# Patient Record
Sex: Male | Born: 1980 | Race: Black or African American | Hispanic: No | Marital: Married | Smoking: Current every day smoker
Health system: Southern US, Community
[De-identification: ages and names within clinical notes are randomized; demographics above are authoritative.]

## PROBLEM LIST (undated history)

## (undated) DIAGNOSIS — R0789 Other chest pain: Secondary | ICD-10-CM

## (undated) DIAGNOSIS — Z0189 Encounter for other specified special examinations: Secondary | ICD-10-CM

## (undated) DIAGNOSIS — I10 Essential (primary) hypertension: Secondary | ICD-10-CM

## (undated) HISTORY — DX: Other chest pain: R07.89

## (undated) HISTORY — DX: Encounter for other specified special examinations: Z01.89

---
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
## 2005-10-08 ENCOUNTER — Ambulatory Visit (HOSPITAL_COMMUNITY): Admission: RE | Admit: 2005-10-08 | Discharge: 2005-10-08 | Payer: Self-pay | Admitting: Family Medicine

## 2011-01-05 ENCOUNTER — Encounter: Payer: Self-pay | Admitting: Family Medicine

## 2012-10-21 ENCOUNTER — Emergency Department (HOSPITAL_COMMUNITY)
Admission: EM | Admit: 2012-10-21 | Discharge: 2012-10-21 | Disposition: A | Discharge status: Home / Self Care | Payer: 59 | Attending: Emergency Medicine | Admitting: Emergency Medicine

## 2012-10-21 ENCOUNTER — Encounter (HOSPITAL_COMMUNITY): Payer: Self-pay | Admitting: Emergency Medicine

## 2012-10-21 DIAGNOSIS — F17200 Nicotine dependence, unspecified, uncomplicated: Secondary | ICD-10-CM | POA: Insufficient documentation

## 2012-10-21 DIAGNOSIS — Z791 Long term (current) use of non-steroidal anti-inflammatories (NSAID): Secondary | ICD-10-CM | POA: Insufficient documentation

## 2012-10-21 DIAGNOSIS — R002 Palpitations: Secondary | ICD-10-CM | POA: Insufficient documentation

## 2012-10-21 DIAGNOSIS — I10 Essential (primary) hypertension: Secondary | ICD-10-CM

## 2012-10-21 LAB — URINALYSIS, ROUTINE W REFLEX MICROSCOPIC
Bilirubin Urine: NEGATIVE
Hgb urine dipstick: NEGATIVE
Specific Gravity, Urine: 1.019 (ref 1.005–1.030)
pH: 7 (ref 5.0–8.0)

## 2012-10-21 LAB — CBC WITH DIFFERENTIAL/PLATELET
Basophils Absolute: 0 10*3/uL (ref 0.0–0.1)
Basophils Relative: 0 % (ref 0–1)
Eosinophils Absolute: 0.1 10*3/uL (ref 0.0–0.7)
Eosinophils Relative: 1 % (ref 0–5)
Lymphocytes Relative: 51 % — ABNORMAL HIGH (ref 12–46)
MCH: 28.4 pg (ref 26.0–34.0)
MCHC: 33.6 g/dL (ref 30.0–36.0)
MCV: 84.5 fL (ref 78.0–100.0)
Platelets: 215 10*3/uL (ref 150–400)
RDW: 12.6 % (ref 11.5–15.5)
WBC: 5.2 10*3/uL (ref 4.0–10.5)

## 2012-10-21 LAB — COMPREHENSIVE METABOLIC PANEL
ALT: 9 U/L (ref 0–53)
AST: 14 U/L (ref 0–37)
Albumin: 3.8 g/dL (ref 3.5–5.2)
Calcium: 9.4 mg/dL (ref 8.4–10.5)
Creatinine, Ser: 0.94 mg/dL (ref 0.50–1.35)
Sodium: 138 mEq/L (ref 135–145)
Total Protein: 6.7 g/dL (ref 6.0–8.3)

## 2012-10-21 MED ORDER — SODIUM CHLORIDE 0.9 % IV BOLUS (SEPSIS)
1000.0000 mL | Freq: Once | INTRAVENOUS | Status: AC
  Administered 2012-10-21: 1000 mL via INTRAVENOUS

## 2012-10-21 NOTE — ED Notes (Signed)
Discharge instructions reviewed. All questions answered.

## 2012-10-21 NOTE — ED Notes (Signed)
Pt alert, arrives from home, c/o "light headedness", onset last pm, states "my heart felt funny", pt speech clear, ambulates to triage, resp even unlabored, skin pwd

## 2012-10-21 NOTE — ED Notes (Signed)
Patient states that since IV was started (fluids) he is feeling a little better. Denies nausea.

## 2012-10-21 NOTE — ED Notes (Addendum)
Patient states he was at work at [employer] if [place] and he became lightheaded and dizzy feeling. No other complaints at this time. Patient denies any strenuous activities at the onset of symptoms.

## 2012-10-21 NOTE — ED Provider Notes (Signed)
History     CSN: [id number]  Arrival date & time 10/21/12  0027   First MD Initiated Contact with Patient 10/21/12 0045      Chief Complaint  Patient presents with  . Dizziness    (Consider location/radiation/quality/duration/timing/severity/associated sxs/prior treatment) HPI PT with lightheadedness when standing. Started last night. Associated with palpitations and chest tightness. No recent illness, fever chills. Had another episode tonight before work. Currently asymptomatic while lying on stretcher. No recent surgeries or travel. No cough, sob or lower ext swelling.  History reviewed. No pertinent past medical history.  History reviewed. No pertinent past surgical history.  No family history on file.  History  Substance Use Topics  . Smoking status: Current Some Day Smoker  . Smokeless tobacco: Not on file  . Alcohol Use: No      Review of Systems  Constitutional: Negative for fever and chills.  HENT: Negative for neck pain.   Eyes: Negative for visual disturbance.  Respiratory: Positive for chest tightness. Negative for cough, shortness of breath and wheezing.   Cardiovascular: Positive for chest pain and palpitations. Negative for leg swelling.  Gastrointestinal: Negative for nausea, vomiting, abdominal pain and diarrhea.  Musculoskeletal: Negative for back pain.  Skin: Negative for rash and wound.  Neurological: Positive for dizziness and light-headedness. Negative for syncope, weakness, numbness and headaches.    Allergies  Review of patient's allergies indicates no known allergies.  Home Medications   Current Outpatient Rx  Name  Route  Sig  Dispense  Refill  . IBUPROFEN 200 MG PO TABS   Oral   Take 400 mg by mouth every 6 (six) hours as needed. For pain           BP 145/93  Pulse 63  Temp 98.6 F (37 C) (Oral)  Resp 20  Wt 220 lb (99.791 kg)  SpO2 100%  Physical Exam  Nursing note and vitals reviewed. Constitutional: He is oriented to  person, place, and time. He appears well-developed and well-nourished. No distress.  HENT:  Head: Normocephalic and atraumatic.  Mouth/Throat: Oropharynx is clear and moist.  Eyes: EOM are normal. Pupils are equal, round, and reactive to light.       No nystagmus  Neck: Normal range of motion. Neck supple.  Cardiovascular: Normal rate and regular rhythm.  Exam reveals no gallop and no friction rub.   No murmur heard. Pulmonary/Chest: Effort normal and breath sounds normal. No respiratory distress. He has no wheezes. He has no rales. He exhibits no tenderness.  Abdominal: Soft. Bowel sounds are normal. He exhibits no distension and no mass. There is no tenderness. There is no rebound and no guarding.  Musculoskeletal: Normal range of motion. He exhibits no edema and no tenderness.       No calf tenderness or swelling  Neurological: He is alert and oriented to person, place, and time.       5/5 motor in all ext. Sensation is intact. Finger to nose intact  Skin: Skin is warm and dry. No rash noted. No erythema.  Psychiatric: He has a normal mood and affect. His behavior is normal.    ED Course  Procedures (including critical care time)  Labs Reviewed  CBC WITH DIFFERENTIAL - Abnormal; Notable for the following:    Neutrophils Relative 40 (*)     Lymphocytes Relative 51 (*)     All other components within normal limits  COMPREHENSIVE METABOLIC PANEL - Abnormal; Notable for the following:    Glucose, Bld  102 (*)     All other components within normal limits  URINALYSIS, ROUTINE W REFLEX MICROSCOPIC   No results found.   1. Hypertension      Date: 10/21/2012  Rate: 69  Rhythm: normal sinus rhythm  QRS Axis: normal  Intervals: normal  ST/T Wave abnormalities: normal  Conduction Disutrbances:none  Narrative Interpretation:   Old EKG Reviewed: none available    MDM    Pt symptoms have resolved. Question hypertensive episodes vs anxiety. Advised to record BP periodically  and f/u with a primary MD. Resources given.       Loren Racer, MD 10/21/12 [phone]

## 2013-11-26 DIAGNOSIS — I10 Essential (primary) hypertension: Secondary | ICD-10-CM | POA: Insufficient documentation

## 2017-02-11 ENCOUNTER — Encounter (HOSPITAL_BASED_OUTPATIENT_CLINIC_OR_DEPARTMENT_OTHER): Payer: Self-pay | Admitting: *Deleted

## 2017-02-11 ENCOUNTER — Emergency Department (HOSPITAL_BASED_OUTPATIENT_CLINIC_OR_DEPARTMENT_OTHER): Payer: 59

## 2017-02-11 ENCOUNTER — Emergency Department (HOSPITAL_BASED_OUTPATIENT_CLINIC_OR_DEPARTMENT_OTHER)
Admission: EM | Admit: 2017-02-11 | Discharge: 2017-02-11 | Disposition: A | Discharge status: Home / Self Care | Payer: 59 | Attending: Emergency Medicine | Admitting: Emergency Medicine

## 2017-02-11 DIAGNOSIS — R079 Chest pain, unspecified: Secondary | ICD-10-CM

## 2017-02-11 DIAGNOSIS — F17200 Nicotine dependence, unspecified, uncomplicated: Secondary | ICD-10-CM | POA: Insufficient documentation

## 2017-02-11 DIAGNOSIS — Z79899 Other long term (current) drug therapy: Secondary | ICD-10-CM | POA: Diagnosis not present

## 2017-02-11 DIAGNOSIS — I10 Essential (primary) hypertension: Secondary | ICD-10-CM | POA: Insufficient documentation

## 2017-02-11 HISTORY — DX: Essential (primary) hypertension: I10

## 2017-02-11 LAB — D-DIMER, QUANTITATIVE (NOT AT ARMC)

## 2017-02-11 LAB — BASIC METABOLIC PANEL
ANION GAP: 7 (ref 5–15)
BUN: 18 mg/dL (ref 6–20)
CHLORIDE: 103 mmol/L (ref 101–111)
CO2: 25 mmol/L (ref 22–32)
Calcium: 9.3 mg/dL (ref 8.9–10.3)
Creatinine, Ser: 1.11 mg/dL (ref 0.61–1.24)
GFR calc non Af Amer: >60 mL/min (ref >60)
Glucose, Bld: 104 mg/dL — ABNORMAL HIGH (ref 65–99)
POTASSIUM: 3.8 mmol/L (ref 3.5–5.1)
SODIUM: 135 mmol/L (ref 135–145)

## 2017-02-11 LAB — CBC
HEMATOCRIT: 40.5 % (ref 39.0–52.0)
HEMOGLOBIN: 13.5 g/dL (ref 13.0–17.0)
MCH: 28.7 pg (ref 26.0–34.0)
MCHC: 33.3 g/dL (ref 30.0–36.0)
MCV: 86 fL (ref 78.0–100.0)
Platelets: 219 10*3/uL (ref 150–400)
RBC: 4.71 MIL/uL (ref 4.22–5.81)
RDW: 12.9 % (ref 11.5–15.5)
WBC: 5.5 10*3/uL (ref 4.0–10.5)

## 2017-02-11 LAB — TROPONIN I: Troponin I: <0.03 ng/mL (ref <0.03)

## 2017-02-11 MED ORDER — ASPIRIN 81 MG PO CHEW
324.0000 mg | CHEWABLE_TABLET | Freq: Once | ORAL | Status: AC
  Administered 2017-02-11: 324 mg via ORAL
  Filled 2017-02-11: qty 4

## 2017-02-11 NOTE — ED Provider Notes (Signed)
[facility] Provider Note   CSN: [id number] Arrival date & time: 02/11/17  1849   By signing my name below, I, [name], attest that this documentation has been prepared under the direction and in the presence of Vanetta Mulders, MD. Electronically signed, [name], ED Scribe. 02/11/17. 7:32 PM.   History   Chief Complaint Chief Complaint  Patient presents with  . Chest Pain   The history is provided by the patient and medical records. No language interpreter was used.    HPI Comments: Kevin Mcgrath is a 36 y.o. male who presents to the Emergency Department complaining of constant left anterior chest pain onset yesterday morning. He denies radiation and he notes exacerbation of pain when swinging the left arm. He notes associated blurred vision, SOB and "strange feeling" in the left arm. He notes similar symptoms ~12/30 without chest pain. He states he has taken ibuprofen to relieve a headache today. Pt denies N/V and taking ASA today.  Past Medical History:  Diagnosis Date  . Hypertension     There are no active problems to display for this patient.   History reviewed. No pertinent surgical history.     Home Medications    Prior to Admission medications   Medication Sig Start Date End Date Taking? Authorizing Provider  AMLODIPINE BESYLATE PO Take by mouth.   Yes Historical Provider, MD  ibuprofen (ADVIL,MOTRIN) 200 MG tablet Take 400 mg by mouth every 6 (six) hours as needed. For pain    Historical Provider, MD    Family History No family history on file.  Social History Social History  Substance Use Topics  . Smoking status: Current Some Day Smoker  . Smokeless tobacco: Never Used  . Alcohol use No     Allergies   Patient has no known allergies.   Review of Systems Review of Systems  Constitutional: Negative for chills and fever.  HENT: Negative for rhinorrhea and sore throat.   Eyes: Positive for visual disturbance.    Respiratory: Positive for shortness of breath. Negative for cough.   Cardiovascular: Positive for chest pain. Negative for leg swelling.  Gastrointestinal: Negative for abdominal pain, diarrhea, nausea and vomiting.  Genitourinary: Negative for dysuria and hematuria.  Musculoskeletal: Negative for back pain and joint swelling.  Skin: Negative for rash.  Neurological: Positive for dizziness and headaches.  Hematological: Does not bruise/bleed easily.     Physical Exam Updated Vital Signs BP 157/97   Pulse 68   Temp 98.1 F (36.7 C) (Oral)   Resp 20   Ht 5\' 11"  (1.803 m)   Wt 225 lb (102.1 kg)   SpO2 99%   BMI 31.38 kg/m   Physical Exam  Constitutional: He is oriented to person, place, and time. He appears well-developed and well-nourished.  HENT:  Head: Normocephalic and atraumatic.  Mouth/Throat: Uvula is midline, oropharynx is clear and moist and mucous membranes are normal.  Eyes: Conjunctivae and EOM are normal. Pupils are equal, round, and reactive to light. No scleral icterus.  Neck: Normal range of motion. Neck supple. No JVD present.  Cardiovascular: Normal rate, regular rhythm, normal heart sounds and intact distal pulses.  Exam reveals no gallop and no friction rub.   No murmur heard. -ankle swelling  Pulmonary/Chest: Effort normal and breath sounds normal. No respiratory distress. He has no wheezes. He has no rales. He exhibits no tenderness.  Abdominal: Soft. He exhibits no distension. There is no tenderness. There is no rebound and no guarding.  Musculoskeletal: Normal range of motion.  Neurological: He is alert and oriented to person, place, and time. No cranial nerve deficit or sensory deficit. He exhibits normal muscle tone. Coordination normal.  Skin: No rash noted. No pallor.  Psychiatric: He has a normal mood and affect. His behavior is normal.  Nursing note and vitals reviewed.    ED Treatments / Results  DIAGNOSTIC STUDIES: Oxygen Saturation is 99%  on RA, normal by my interpretation.    COORDINATION OF CARE: 7:31 PM Discussed treatment plan with pt at bedside and pt agreed to plan. Will order medication and reassess.  Labs (all labs ordered are listed, but only abnormal results are displayed) Labs Reviewed  BASIC METABOLIC PANEL - Abnormal; Notable for the following:       Result Value   Glucose, Bld 104 (*)    All other components within normal limits  CBC  TROPONIN I  D-DIMER, QUANTITATIVE (NOT AT [facility])    EKG  EKG Interpretation  Date/Time:  Tuesday February 11 2017 19:09:35 EST Ventricular Rate:  62 PR Interval:  176 QRS Duration: 86 QT Interval:  397 QTC Calculation: 404 R Axis:   66 Text Interpretation:  Sinus rhythm Borderline T wave abnormalities Confirmed by Deretha EmoryZACKOWSKI  MD, Sundee Garland [phone]([id number]) on 02/11/2017 7:13:14 PM       Radiology Dg Chest 2 View  Result Date: 02/11/2017 CLINICAL DATA:  Chest pain sudden onset x today radiates to LUE. Shielded EXAM: CHEST  2 VIEW COMPARISON:  None. FINDINGS: Midline trachea.  Normal heart size and mediastinal contours. Sharp costophrenic angles.  No pneumothorax.  Clear lungs. IMPRESSION: No active cardiopulmonary disease. Electronically Signed   By: Kevin Mcgrath  Talbot M.D.   On: 02/11/2017 19:36    Procedures Procedures (including critical care time)  Medications Ordered in ED Medications  aspirin chewable tablet 324 mg (324 mg Oral Given 02/11/17 1945)     Initial Impression / Assessment and Plan / ED Course  I have reviewed the triage vital signs and the nursing notes.  Pertinent labs & imaging results that were available during my care of the patient were reviewed by me and considered in my medical decision making (see chart for details).   patient's chest pain workup without any acute findings. Symptoms present since yesterday morning constantly first troponin negative essentially ruling out any unstable angina or acute cardiac event. Chest x-rays also negative for pneumonia  or pneumothorax. An d-dimer was negative as of aching pulmonary embolus very unlikely. Patient recently stopped smoking about 3 days ago slipped possible other symptoms could be related to nicotine withdrawal. Patient will be started on a baby aspirin regimen and follow-up with cardiology.  I personally performed the services described in this documentation, which was scribed in my presence. The recorded information has been reviewed and is accurate.     Final Clinical Impressions(s) / ED Diagnoses   Final diagnoses:  Chest pain, unspecified type  Essential hypertension    New Prescriptions New Prescriptions   No medications on file     Vanetta MuldersScott Jamicia Haaland, MD 02/11/17 2112

## 2017-02-11 NOTE — ED Triage Notes (Signed)
Chest pain since yesterday. [facility] was seen [facility] and had a normal EKG. He was told to come here for further testing. Sob every now and then.

## 2017-02-11 NOTE — ED Notes (Signed)
ED Provider at bedside. 

## 2017-02-11 NOTE — Discharge Instructions (Signed)
Today's workup without any acute findings. Start the regimen of a baby aspirin a day. Make an appointment to follow-up with cardiology. So make up limited to have your blood pressure followed up. Return for any new or worse symptoms.

## 2017-12-26 ENCOUNTER — Telehealth (HOSPITAL_COMMUNITY): Payer: Self-pay | Admitting: Family Medicine

## 2017-12-26 ENCOUNTER — Other Ambulatory Visit: Payer: Self-pay | Admitting: Family Medicine

## 2017-12-26 DIAGNOSIS — R011 Cardiac murmur, unspecified: Secondary | ICD-10-CM

## 2017-12-26 NOTE — Telephone Encounter (Signed)
12/26/2017 11:39 AM Phone (Outgoing) Trude McburneyOsiwowo, Keandre (Self) [phone] (H)   Left Message - Called pt and lmsg for pt to CB to get scheduled for an echo.    By Elita BooneGriffin, Jaclene Bartelt A

## 2017-12-30 ENCOUNTER — Other Ambulatory Visit: Payer: Self-pay

## 2017-12-30 ENCOUNTER — Ambulatory Visit (HOSPITAL_COMMUNITY): Payer: 59 | Attending: Cardiology

## 2017-12-30 DIAGNOSIS — Z8249 Family history of ischemic heart disease and other diseases of the circulatory system: Secondary | ICD-10-CM | POA: Diagnosis not present

## 2017-12-30 DIAGNOSIS — Z79899 Other long term (current) drug therapy: Secondary | ICD-10-CM | POA: Diagnosis not present

## 2017-12-30 DIAGNOSIS — R011 Cardiac murmur, unspecified: Secondary | ICD-10-CM | POA: Diagnosis not present

## 2017-12-30 DIAGNOSIS — Z833 Family history of diabetes mellitus: Secondary | ICD-10-CM | POA: Insufficient documentation

## 2017-12-30 DIAGNOSIS — I253 Aneurysm of heart: Secondary | ICD-10-CM | POA: Diagnosis not present

## 2017-12-30 DIAGNOSIS — F17210 Nicotine dependence, cigarettes, uncomplicated: Secondary | ICD-10-CM | POA: Diagnosis not present

## 2017-12-30 DIAGNOSIS — I119 Hypertensive heart disease without heart failure: Secondary | ICD-10-CM | POA: Diagnosis not present

## 2017-12-30 DIAGNOSIS — G4733 Obstructive sleep apnea (adult) (pediatric): Secondary | ICD-10-CM | POA: Insufficient documentation

## 2017-12-30 DIAGNOSIS — Z888 Allergy status to other drugs, medicaments and biological substances status: Secondary | ICD-10-CM | POA: Diagnosis not present

## 2018-03-08 IMAGING — CR DG CHEST 2V
2 series · 2 of 2 positions shown · non-contrast
Comparison: None.

CLINICAL DATA: Chest pain sudden onset x today radiates to LUE.
Shielded

EXAM:
CHEST  2 VIEW

[w chest pa]
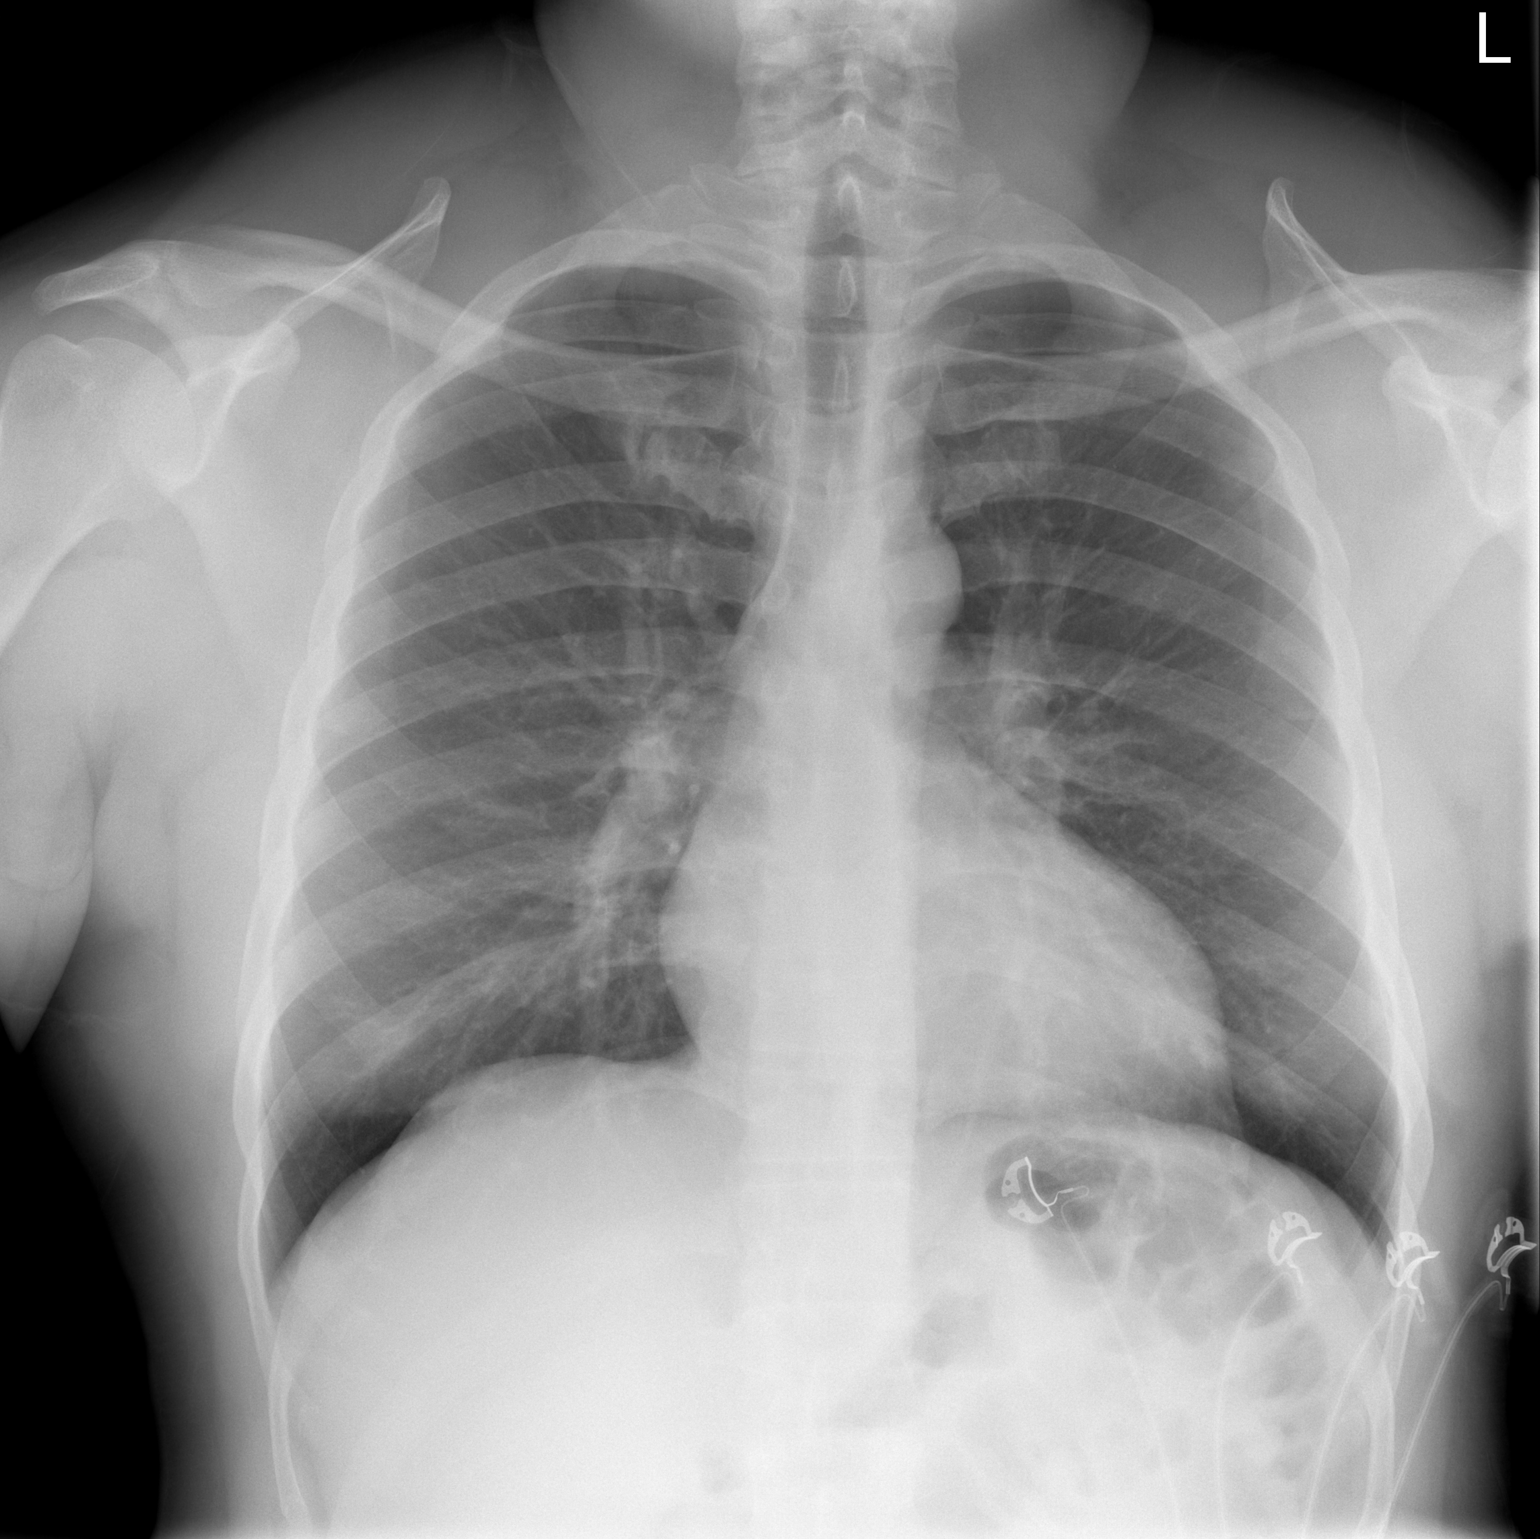

[w chest lat]
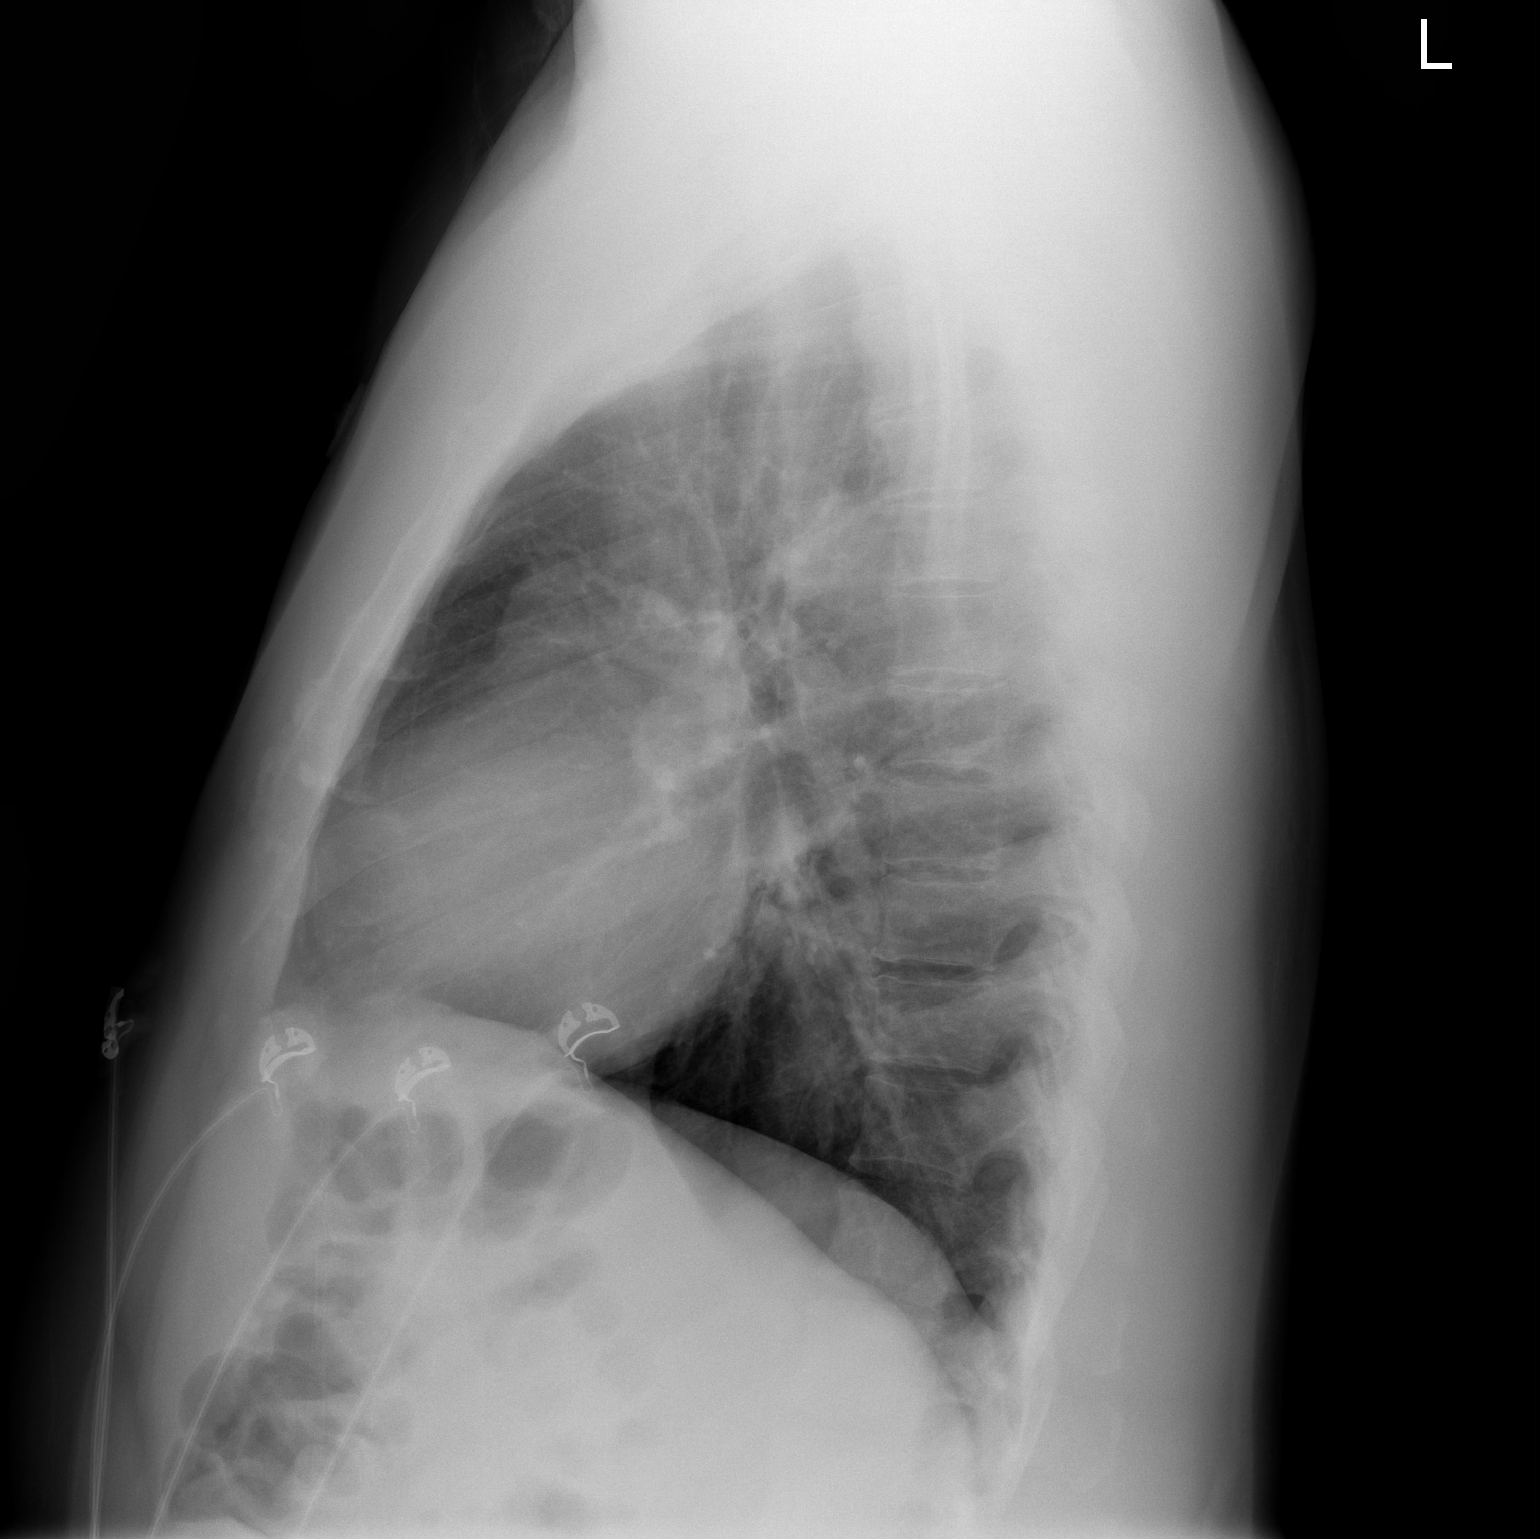

[2 of 2 positions shown; findings below may reference images not displayed]

FINDINGS: Midline trachea.  Normal heart size and mediastinal contours.

Sharp costophrenic angles.  No pneumothorax.  Clear lungs.
IMPRESSION: No active cardiopulmonary disease.

## 2018-03-30 ENCOUNTER — Emergency Department (HOSPITAL_BASED_OUTPATIENT_CLINIC_OR_DEPARTMENT_OTHER): Payer: 59

## 2018-03-30 ENCOUNTER — Other Ambulatory Visit: Payer: Self-pay

## 2018-03-30 ENCOUNTER — Emergency Department (HOSPITAL_BASED_OUTPATIENT_CLINIC_OR_DEPARTMENT_OTHER)
Admission: EM | Admit: 2018-03-30 | Discharge: 2018-03-30 | Disposition: A | Discharge status: Home / Self Care | Payer: 59 | Attending: Emergency Medicine | Admitting: Emergency Medicine

## 2018-03-30 ENCOUNTER — Encounter (HOSPITAL_BASED_OUTPATIENT_CLINIC_OR_DEPARTMENT_OTHER): Payer: Self-pay | Admitting: Emergency Medicine

## 2018-03-30 DIAGNOSIS — R079 Chest pain, unspecified: Secondary | ICD-10-CM | POA: Insufficient documentation

## 2018-03-30 DIAGNOSIS — I10 Essential (primary) hypertension: Secondary | ICD-10-CM | POA: Diagnosis not present

## 2018-03-30 DIAGNOSIS — I159 Secondary hypertension, unspecified: Secondary | ICD-10-CM

## 2018-03-30 DIAGNOSIS — R202 Paresthesia of skin: Secondary | ICD-10-CM | POA: Diagnosis not present

## 2018-03-30 DIAGNOSIS — R531 Weakness: Secondary | ICD-10-CM | POA: Diagnosis not present

## 2018-03-30 DIAGNOSIS — R42 Dizziness and giddiness: Secondary | ICD-10-CM | POA: Diagnosis not present

## 2018-03-30 DIAGNOSIS — R0602 Shortness of breath: Secondary | ICD-10-CM | POA: Insufficient documentation

## 2018-03-30 DIAGNOSIS — R51 Headache: Secondary | ICD-10-CM | POA: Insufficient documentation

## 2018-03-30 DIAGNOSIS — F17200 Nicotine dependence, unspecified, uncomplicated: Secondary | ICD-10-CM | POA: Insufficient documentation

## 2018-03-30 LAB — BASIC METABOLIC PANEL
Anion gap: 8 (ref 5–15)
BUN: 14 mg/dL (ref 6–20)
CO2: 23 mmol/L (ref 22–32)
Calcium: 9 mg/dL (ref 8.9–10.3)
Chloride: 105 mmol/L (ref 101–111)
Creatinine, Ser: 1.16 mg/dL (ref 0.61–1.24)
GFR calc Af Amer: >60 mL/min (ref >60)
GFR calc non Af Amer: >60 mL/min (ref >60)
Glucose, Bld: 101 mg/dL — ABNORMAL HIGH (ref 65–99)
Potassium: 3.5 mmol/L (ref 3.5–5.1)
Sodium: 136 mmol/L (ref 135–145)

## 2018-03-30 LAB — CBC
HCT: 39.2 % (ref 39.0–52.0)
Hemoglobin: 13.4 g/dL (ref 13.0–17.0)
MCH: 29.3 pg (ref 26.0–34.0)
MCHC: 34.2 g/dL (ref 30.0–36.0)
MCV: 85.8 fL (ref 78.0–100.0)
Platelets: 209 10*3/uL (ref 150–400)
RBC: 4.57 MIL/uL (ref 4.22–5.81)
RDW: 12.9 % (ref 11.5–15.5)
WBC: 5.2 10*3/uL (ref 4.0–10.5)

## 2018-03-30 LAB — URINALYSIS, ROUTINE W REFLEX MICROSCOPIC
Bilirubin Urine: NEGATIVE
Glucose, UA: NEGATIVE mg/dL
Hgb urine dipstick: NEGATIVE
Ketones, ur: NEGATIVE mg/dL
Leukocytes, UA: NEGATIVE
Nitrite: NEGATIVE
Protein, ur: NEGATIVE mg/dL
Specific Gravity, Urine: 1.01 (ref 1.005–1.030)
pH: 6.5 (ref 5.0–8.0)

## 2018-03-30 LAB — TROPONIN I: Troponin I: <0.03 ng/mL (ref <0.03)

## 2018-03-30 MED ORDER — IOPAMIDOL (ISOVUE-370) INJECTION 76%
100.0000 mL | Freq: Once | INTRAVENOUS | Status: AC | PRN
  Administered 2018-03-30: 100 mL via INTRAVENOUS

## 2018-03-30 NOTE — Discharge Instructions (Addendum)
Please follow up with your doctor closely for further evaluation and treatment of your symptoms. Your blood pressure is high today, follow up with your doctor to adjust your medications. Return if worsening symptoms.

## 2018-03-30 NOTE — ED Triage Notes (Signed)
Pt states he has been having some issues with feeling tired and dizziness since last week.  Pt states he started to have intermittent chest pain today.  One episode of diaphoresis.  Some sob.  Some nausea.

## 2018-03-30 NOTE — ED Provider Notes (Signed)
[facility] EMERGENCY DEPARTMENT Provider Note   CSN: [id number] Arrival date & time: 03/30/18  1741     History   Chief Complaint Chief Complaint  Patient presents with  . Chest Pain    HPI Kevin Mcgrath is a 37 y.o. male.  HPI Kevin Mcgrath is a 37 y.o. male with history of hypertension, presents to emergency department complaining of a week of intermittent headaches, tingling in both hands and both feet, today states felt like he had chest pain that would come and go while at work.  Chest pain was nonexertional, describes it as burning, with associated shortness of breath and dizziness.  He states he has been feeling weak over the last week.  He is on losartan, states takes that for his blood pressure, blood pressure is high.  Denies any active chest pain at this time.  No abdominal pain.  No nausea or vomiting.  No blurred vision, however states his vision has been "fuzzy" on and off.  He states today while at work he could not focus and felt disoriented. Denies drugs or alcohol  Past Medical History:  Diagnosis Date  . Hypertension     There are no active problems to display for this patient.   History reviewed. No pertinent surgical history.      Home Medications    Prior to Admission medications   Not on File    Family History No family history on file.  Social History Social History   Tobacco Use  . Smoking status: Current Every Day Smoker  . Smokeless tobacco: Never Used  Substance Use Topics  . Alcohol use: Not on file  . Drug use: Not on file     Allergies   Norvasc [amlodipine besylate]   Review of Systems Review of Systems  Constitutional: Positive for fatigue. Negative for chills and fever.  Eyes: Positive for visual disturbance.  Respiratory: Positive for chest tightness and shortness of breath. Negative for cough.   Cardiovascular: Positive for chest pain. Negative for palpitations and leg swelling.  Gastrointestinal: Negative  for abdominal distention, abdominal pain, diarrhea, nausea and vomiting.  Genitourinary: Negative for dysuria, frequency, hematuria and urgency.  Musculoskeletal: Negative for arthralgias, myalgias, neck pain and neck stiffness.  Skin: Negative for rash.  Allergic/Immunologic: Negative for immunocompromised state.  Neurological: Positive for dizziness, light-headedness, numbness and headaches. Negative for weakness.  All other systems reviewed and are negative.    Physical Exam Updated Vital Signs BP (!) 177/113   Pulse 85   Temp 98.8 F (37.1 C) (Oral)   Resp 16   Ht 5\' 9"  (1.753 m)   Wt 107 kg (236 lb)   SpO2 100%   BMI 34.85 kg/m   Physical Exam  Constitutional: He is oriented to person, place, and time. He appears well-developed and well-nourished. No distress.  HENT:  Head: Normocephalic and atraumatic.  Eyes: Pupils are equal, round, and reactive to light. Conjunctivae and EOM are normal.  Neck: Neck supple.  Cardiovascular: Normal rate, regular rhythm and normal heart sounds.  Pulmonary/Chest: Effort normal. No respiratory distress. He has no wheezes. He has no rales.  Abdominal: Soft. Bowel sounds are normal. He exhibits no distension. There is no tenderness. There is no rebound.  Musculoskeletal: He exhibits no edema.  Neurological: He is alert and oriented to person, place, and time.  5/5 and equal upper and lower extremity strength bilaterally. Equal grip strength bilaterally. Normal finger to nose and heel to shin. No pronator drift.  Skin: Skin is warm and dry.  Nursing note and vitals reviewed.    ED Treatments / Results  Labs (all labs ordered are listed, but only abnormal results are displayed) Labs Reviewed  BASIC METABOLIC PANEL - Abnormal; Notable for the following components:      Result Value   Glucose, Bld 101 (*)    All other components within normal limits  CBC  TROPONIN I  URINALYSIS, ROUTINE W REFLEX MICROSCOPIC    EKG EKG  Interpretation  Date/Time:  Monday March 30 2018 17:52:16 EDT Ventricular Rate:  86 PR Interval:  162 QRS Duration: 76 QT Interval:  376 QTC Calculation: 449 R Axis:   50 Text Interpretation:  Normal sinus rhythm Cannot rule out Anterior infarct , age undetermined Abnormal ECG no prior available for comparison Confirmed by Tilden Fossa [phone]) on 03/30/2018 5:56:13 PM   Radiology Dg Chest 2 View  Result Date: 03/30/2018 CLINICAL DATA:  Intermittent chest pain tightness. EXAM: CHEST - 2 VIEW COMPARISON:  No comparison studies available. FINDINGS: The lungs are clear without focal pneumonia, edema, pneumothorax or pleural effusion. The cardiopericardial silhouette is within normal limits for size. The visualized bony structures of the thorax are intact. Nodular density/densities projecting over the lungs are compatible with pads for telemetry leads. IMPRESSION: No active cardiopulmonary disease. Electronically Signed   By: Kennith Center M.D.   On: 03/30/2018 19:02   Ct Head Wo Contrast  Result Date: 03/30/2018 CLINICAL DATA:  Feeling tired. EXAM: CT HEAD WITHOUT CONTRAST TECHNIQUE: Contiguous axial images were obtained from the base of the skull through the vertex without intravenous contrast. COMPARISON:  No comparison studies available. FINDINGS: Brain: There is no evidence for acute hemorrhage, hydrocephalus, mass lesion, or abnormal extra-axial fluid collection. No definite CT evidence for acute infarction. Vascular: No hyperdense vessel or unexpected calcification. Skull: No evidence for fracture. No worrisome lytic or sclerotic lesion. Sinuses/Orbits: The visualized paranasal sinuses and mastoid air cells are clear. Visualized portions of the globes and intraorbital fat are unremarkable. Other: None. IMPRESSION: Normal exam. Electronically Signed   By: Kennith Center M.D.   On: 03/30/2018 19:10   Ct Angio Chest/abd/pel For Dissection W And/or Wo Contrast  Result Date: 03/30/2018 CLINICAL  DATA:  Tired and dizzy intermittent chest pain EXAM: CT ANGIOGRAPHY CHEST, ABDOMEN AND PELVIS TECHNIQUE: Multidetector CT imaging through the chest, abdomen and pelvis was performed using the standard protocol during bolus administration of intravenous contrast. Multiplanar reconstructed images and MIPs were obtained and reviewed to evaluate the vascular anatomy. CONTRAST:  ISOVUE-370 IOPAMIDOL (ISOVUE-370) INJECTION 76% COMPARISON:  Chest x-ray 03/30/2018 FINDINGS: CTA CHEST FINDINGS Cardiovascular: Non contrasted images of the chest demonstrate no intramural hematoma. Nonaneurysmal aorta. No dissection. Normal heart size. No pericardial effusion Mediastinum/Nodes: No enlarged mediastinal, hilar, or axillary lymph nodes. Thyroid gland, trachea, and esophagus demonstrate no significant findings. Lungs/Pleura: Lungs are clear. No pleural effusion or pneumothorax. Musculoskeletal: No chest wall abnormality. No acute or significant osseous findings. Review of the MIP images confirms the above findings. CTA ABDOMEN AND PELVIS FINDINGS VASCULAR Aorta: Normal caliber aorta without aneurysm, dissection, vasculitis or significant stenosis. Celiac: Patent without evidence of aneurysm, dissection, vasculitis or significant stenosis. 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 Vessel distal to this is widely patent. Renals: Single right and single left patent renal arteries. IMA: Patent without evidence of aneurysm, dissection, vasculitis or significant stenosis. Inflow:  Patent without evidence of aneurysm, dissection, vasculitis or significant stenosis. Review of the MIP images confirms the above findings. NON-VASCULAR Hepatobiliary: No focal liver abnormality is seen. No gallstones, gallbladder wall thickening, or biliary dilatation. Pancreas: Unremarkable. No pancreatic ductal dilatation or surrounding inflammatory  changes. Spleen: Normal in size without focal abnormality. Adrenals/Urinary Tract: Adrenal glands are unremarkable. Kidneys are normal, without renal calculi, focal lesion, or hydronephrosis. Bladder is unremarkable. Stomach/Bowel: Stomach is within normal limits. Appendix appears normal. No evidence of bowel wall thickening, distention, or inflammatory changes. Lymphatic: No significant adenopathy Reproductive: Prostate is unremarkable. Other: Negative for free air or free fluid Musculoskeletal: No acute or significant osseous findings. Review of the MIP images confirms the above findings. IMPRESSION: 1. Negative for acute aortic dissection or aneurysm. 2. 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 3. No CT evidence for acute intra-abdominal or pelvic abnormality. Electronically Signed   By: Jasmine PangKim  Fujinaga M.D.   On: 03/30/2018 21:48    Procedures Procedures (including critical care time)  Medications Ordered in ED Medications - No data to display   Initial Impression / Assessment and Plan / ED Course  I have reviewed the triage vital signs and the nursing notes.  Pertinent labs & imaging results that were available during my care of the patient were reviewed by me and considered in my medical decision making (see chart for details).     Pt with CP, SOB, dizziness, headache, visual changes on and off for a week. States chest pains worse today which is what made him come in.  Patient is in no acute distress.  Exam unremarkable.  Will check labs, will get CT head due to neurological symptoms, will monitor.  Labs unremarkable. CT head and CXR negative. Given chest pain for greater than 6 hrs, do not think he needs 2nd trop. He is hypertensive, and in setting of CP and diaphoresis, and dizziness, with visual change and numbness in  extremities will get ct angio to ro dissection.   10:12 PM CT negative for dissection, showing narrowing in SMA. Discussed with pt results. Still hypertensive. Apparently recently discontinued norvasc bc was not tolerating it.  Discussed close follow-up with family doctor for further evaluation of his episodes as well as for further management of his high blood pressure.  Question whether patient's symptoms could be due to panic attacks.  Discussed signs and symptoms that should prompt his return back to emergency department.  Patient agreed.  At this time, highly  doubt ACS, symptoms are atypical, negative troponin and EKG, no dissection, no concern for PE, currently completely asymptomatic. Stable for dc home.   Vitals:   03/30/18 2000 03/30/18 2030 03/30/18 2100 03/30/18 2202  BP: (!) 171/93 (!) 165/106 (!) 167/115 (!) 183/116  Pulse: 69 66 66 68  Resp: 13 15 16 17   Temp:      TempSrc:      SpO2: 97% 99% 100% 100%  Weight:      Height:         Final Clinical Impressions(s) / ED Diagnoses   Final diagnoses:  Chest pain, unspecified type  Dizziness  Secondary hypertension    ED Discharge Orders    None       Iona CoachKirichenko, Randy Whitener, PA-C 03/30/18 2214    Tilden Fossaees, Elizabeth, MD 03/31/18 0100

## 2018-04-01 ENCOUNTER — Encounter (HOSPITAL_BASED_OUTPATIENT_CLINIC_OR_DEPARTMENT_OTHER): Payer: Self-pay | Admitting: *Deleted

## 2018-04-27 ENCOUNTER — Other Ambulatory Visit (HOSPITAL_BASED_OUTPATIENT_CLINIC_OR_DEPARTMENT_OTHER): Payer: Self-pay

## 2018-04-27 DIAGNOSIS — G4733 Obstructive sleep apnea (adult) (pediatric): Secondary | ICD-10-CM

## 2018-05-22 ENCOUNTER — Ambulatory Visit (HOSPITAL_BASED_OUTPATIENT_CLINIC_OR_DEPARTMENT_OTHER): Payer: 59 | Attending: Internal Medicine | Admitting: Internal Medicine

## 2018-05-22 DIAGNOSIS — G4733 Obstructive sleep apnea (adult) (pediatric): Secondary | ICD-10-CM | POA: Insufficient documentation

## 2018-06-01 NOTE — Procedures (Signed)
   NAME: Kevin Mcgrath DATE OF BIRTH:  02/19/1981 MEDICAL RECORD NUMBER [id number]  LOCATION: [facility]  PHYSICIAN: Deretha EmoryJames C Osborne  DATE OF STUDY: 05/22/2018  SLEEP STUDY TYPE: Positive Airway Pressure Titration               REFERRING PHYSICIAN: Deretha Emorysborne, James C, MD  INDICATION FOR STUDY: inadequate airway control on auto adjusting CPAP; excessive mask leak on auto adjusting CPAP. Severe OSA with AHI 50/hr on home sleep apnea test.   EPWORTH SLEEPINESS SCORE:  NA HEIGHT: 5\' 11"  (180.3 cm)  WEIGHT: 232 lb (105.2 kg)    Body mass index is 32.36 kg/m.  NECK SIZE: 18 in.  MEDICATIONS  Patient self administered medications include: N/A. Medications administered during study include No sleep medicine administered.Marland Kitchen.   SLEEP STUDY TECHNIQUE  The patient underwent an attended overnight polysomnography titration to assess the effects of cpap therapy. The following variables were monitored: EEG (C4-A1, C3-A2, O1-A2, O2-A1, F3-M2, F4-M1), EOG, submental and leg EMG, ECG, oxyhemoglobin saturation by pulse oximetry, thoracic and abdominal respiratory effort belts, nasal/oral airflow by pressure sensor, body position sensor and snoring sensor. CPAP pressure was titrated to eliminate apneas, hypopneas and oxygen desaturation.   TECHNICAL COMMENTS  Comments added by Technician: NONE Comments added by Scorer: N/A   SLEEP ARCHITECTURE  The study was initiated at 10:49:25 PM and terminated at 4:51:22 AM. Total recorded time was 362 minutes. EEG confirmed total sleep time was 332.5 minutes yielding a sleep efficiency of 91.9%%. Sleep onset after lights out was 9.3 minutes with a REM latency of 129.0 minutes. The patient spent 1.5%% of the night in stage N1 sleep, 73.7%% in stage N2 sleep, 0.2%% in stage N3 and 24.66% in REM. The Arousal Index was 7.9/hour.   RESPIRATORY PARAMETERS  The overall AHI was 19.1 per hour, and the RDI was 23.3 events/hour with a central apnea index  of 1.8per hour. The most appropriate setting of BiPAP was IPAP/EPAP 23/19 cm H2O. At this setting, the sleep efficiency was 93 % and the patient was supine for 100%. The AHI was 5.2 events per hour, and the RDI was 8.7 events/hour (with 1.8 central events) and the arousal index was 5.2 per hour.The oxygen nadir was 91.0% during sleep.   LEG MOVEMENT DATA  The total leg movements were 0 with a resulting leg movement index of 0.0. Associated arousal with leg movement index was 0.0.   CARDIAC DATA  The underlying cardiac rhythm was most consistent with sinus rhythm. Mean heart rate during sleep was 67.0 bpm. Additional rhythm abnormalities include None.   IMPRESSIONS  Adequate titration of patients severe OSA with use of BPAP 23/19 or 22/18.  Patient used a Designer, fashion/clothingisher-Paykel Simplus Full Face Mask, medium, succesfully  DIAGNOSIS  Obstructive Sleep Apnea (327.23 [G47.33 ICD-10])  RECOMMENDATIONS  Trial of BiPAP therapy on 23/19 cm H2O with a Medium size Fisher[employer]kel Full Face Mask Simplus mask and heated humidification. Patient may use ramp if desired. Recommend starting at 8/4 with 20 minute ramp to 23/19.  Patient will not need followup oximetry    Deretha EmoryJames C Osborne Sleep specialist, American Board of Internal Medicine  ELECTRONICALLY SIGNED ON:  06/01/2018, 8:55 PM [facility] PH: [phone]   FX: [phone] ACCREDITED BY THE AMERICAN ACADEMY OF SLEEP MEDICINE

## 2019-03-07 NOTE — Progress Notes (Signed)
Patient is here for follow up visit.  Subjective:   '@Patient'  ID: Kevin Mcgrath, male    DOB: [birth year], 38 y.o.   MRN: [id number]  Chief Complaint  Patient presents with   Hypertension   Follow-up    HPI  38 y/o [place] American male with hyertension, h/o tobacco abuse, was seen by me for exertional dyspnea and atypical chest pain in 10/2018. Workup showed mod LVH, incidental finding of possible PFI, normal stress test. Regarding hypertension management, I stopped his losartan, and started coreg 6.25 mg bid.  Patient is here for follow up visit.  Patient is only been using carvedilol 6.25 mg once daily, as reported dizziness when he took it twice daily.  Reports blood pressure has been controlled on his home monitor checks less than 130/80 mmHg.  Blood pressure elevated today during his office visit.  He has occasional sharp chest pain on the left side, but lasts only for few seconds.  Of note, his stress test was reassuring.  Patient reports that he had been working out regularly until recently, without any chest pain, shortness of breath.  Physical activity is currently limited as his gym is closed.   Past Medical History:  Diagnosis Date   Hypertension      History reviewed. No pertinent surgical history.   Social History   Socioeconomic History   Marital status: Married    Spouse name: Not on file   Number of children: 3   Years of education: Not on file   Highest education level: Not on file  Occupational History   Not on file  Social Needs   Financial resource strain: Not on file   Food insecurity:    Worry: Not on file    Inability: Not on file   Transportation needs:    Medical: Not on file    Non-medical: Not on file  Tobacco Use   Smoking status: Current Every Day Smoker    Packs/day: 0.50    Types: Cigarettes   Smokeless tobacco: Never Used  Substance and Sexual Activity   Alcohol use: Yes    Comment: occ   Drug use: Never    Sexual activity: Not on file  Lifestyle   Physical activity:    Days per week: Not on file    Minutes per session: Not on file   Stress: Not on file  Relationships   Social connections:    Talks on phone: Not on file    Gets together: Not on file    Attends religious service: Not on file    Active member of club or organization: Not on file    Attends meetings of clubs or organizations: Not on file    Relationship status: Not on file   Intimate partner violence:    Fear of current or ex partner: Not on file    Emotionally abused: Not on file    Physically abused: Not on file    Forced sexual activity: Not on file  Other Topics Concern   Not on file  Social History Narrative   ** Merged History Encounter **         Current Outpatient Medications on File Prior to Visit  Medication Sig Dispense Refill   AMLODIPINE BESYLATE PO Take 5 mg by mouth daily.      carvedilol (COREG) 6.25 MG tablet Take 6.25 mg by mouth daily.     nitroGLYCERIN (NITROSTAT) 0.4 MG SL tablet Place 0.4 mg under the tongue every  5 (five) minutes as needed for chest pain.     ibuprofen (ADVIL,MOTRIN) 200 MG tablet Take 400 mg by mouth every 6 (six) hours as needed. For pain     No current facility-administered medications on file prior to visit.     Cardiovascular studies:   Exercise sestamibi stress test 11/27/2018: 1. The patient performed treadmill exercise using Bruce protocol, completing 7:09 minutes. The patient completed an estimated workload of 8.8 METS, reaching 86% of the maximum predicted heart rate. Normal hemodynamic response and exercise capacity was seen. Stress symptoms included dyspnea. 2. The overall quality of the study is excellent. There is no evidence of abnormal lung activity. Stress and rest SPECT images demonstrate homogeneous tracer distribution throughout the myocardium. Gated SPECT imaging reveals normal myocardial thickening and wall motion. The left ventricular ejection  fraction was normal (50%). No ischemic changes seen on stress electrocardiogram. 3. Low risk study.  Echocardiogram 11/10/2018: Left ventricle cavity is normal in size. Moderate concentric hypertrophy of the left ventricle. Normal global wall motion. Doppler evidence of grade I (impaired) diastolic dysfunction, normal LAP. Calculated EF 55%. Left atrial cavity is mildly dilated. 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 Mild tricuspid regurgitation. No evidence of pulmonary hypertension.  EKG 10/21/2018: Sinus rhythm 79 bpm. Normal axis. Normal conduction. Bilateral T wave inversion, cannot exclude ischemia.   CT head 03/30/2018: Normal exam.  Recent labs: 10/16/2018: H/H 13/40. MCV 85. Platelets 242 Glucose 107. BUN/creatinine 10/1.5. EGFR 50. Sodium 143, potassium 4.3 Cholesterol 165, triglycerides 138, HDL 48, LDL 89.   03/30/2018: H/H 13/39. MCV 85. Platelets 209 Glucose 11. BUN/creatinine 14/1.16. EGFR normal. Sodium 136, potassium 3.5. Troponin negative.   Review of Systems  Constitution: Negative for decreased appetite, malaise/fatigue, weight gain and weight loss.  HENT: Negative for congestion.   Eyes: Negative for visual disturbance.  Cardiovascular: Positive for chest pain (Occasional). Negative for claudication, dyspnea on exertion, leg swelling, palpitations and syncope.  Respiratory: Negative for shortness of breath.   Endocrine: Negative for cold intolerance.  Hematologic/Lymphatic: Does not bruise/bleed easily.  Skin: Negative for itching and rash.  Musculoskeletal: Negative for myalgias.  Gastrointestinal: Negative for abdominal pain, nausea and vomiting.  Genitourinary: Negative for dysuria.  Neurological: Negative for dizziness and weakness.  Psychiatric/Behavioral: The patient is not nervous/anxious.   All other systems reviewed and are negative.      Objective:   Vitals:   03/08/19 0946  BP: (!) 147/101  Pulse: 71  SpO2: 98%     Physical Exam    Constitutional: He is oriented to person, place, and time. He appears well-developed and well-nourished. No distress.  HENT:  Head: Normocephalic and atraumatic.  Eyes: Pupils are equal, round, and reactive to light. Conjunctivae are normal.  Neck: No JVD present.  Cardiovascular: Normal rate, regular rhythm and intact distal pulses.  Pulmonary/Chest: Effort normal and breath sounds normal. He has no wheezes. He has no rales.  Abdominal: Soft. Bowel sounds are normal. There is no rebound.  Musculoskeletal:        General: No edema.  Lymphadenopathy:    He has no cervical adenopathy.  Neurological: He is alert and oriented to person, place, and time. No cranial nerve deficit.  Skin: Skin is warm and dry.  Psychiatric: He has a normal mood and affect.  Nursing note and vitals reviewed.       Assessment & Recommendations:   38 y/o [place] American male with hyertension, CKD 3, h/o tobacco abuse, here for follow up  Hypertension:  Suboptimal control.  Increase carvedilol to 6.25 mg twice daily.  Continue amlodipine 5 mg daily.  Chest pain: Noncardiac.  Reassuring stress test.  Suspect musculoskeletal etiology.  Recommend follow-up with PCP.  I will see him on as-needed basis.  Nigel Mormon, MD [facility] [place] Pager: [phone] Office: [phone] If no answer Cell [phone]

## 2019-03-08 ENCOUNTER — Encounter: Payer: Self-pay | Admitting: Cardiology

## 2019-03-08 ENCOUNTER — Other Ambulatory Visit: Payer: Self-pay

## 2019-03-08 ENCOUNTER — Ambulatory Visit (INDEPENDENT_AMBULATORY_CARE_PROVIDER_SITE_OTHER): Payer: 59 | Admitting: Cardiology

## 2019-03-08 VITALS — BP 147/101 | HR 71 | Ht 71.0 in | Wt 243.5 lb

## 2019-03-08 DIAGNOSIS — R0789 Other chest pain: Secondary | ICD-10-CM | POA: Diagnosis not present

## 2019-03-08 DIAGNOSIS — Z0189 Encounter for other specified special examinations: Secondary | ICD-10-CM

## 2019-03-08 DIAGNOSIS — I10 Essential (primary) hypertension: Secondary | ICD-10-CM | POA: Diagnosis not present

## 2019-03-08 HISTORY — DX: Encounter for other specified special examinations: Z01.89

## 2019-03-08 HISTORY — DX: Other chest pain: R07.89

## 2019-06-04 ENCOUNTER — Other Ambulatory Visit: Payer: Self-pay

## 2019-06-04 ENCOUNTER — Encounter: Payer: Self-pay | Admitting: Cardiology

## 2019-06-04 ENCOUNTER — Ambulatory Visit: Payer: 59 | Admitting: Cardiology

## 2019-06-04 VITALS — BP 171/117 | HR 77 | Temp 97.7°F | Ht 71.0 in | Wt 229.0 lb

## 2019-06-04 DIAGNOSIS — I10 Essential (primary) hypertension: Secondary | ICD-10-CM | POA: Diagnosis not present

## 2019-06-04 DIAGNOSIS — R0789 Other chest pain: Secondary | ICD-10-CM

## 2019-06-04 NOTE — Progress Notes (Signed)
Patient is here for follow up visit.  Subjective:   _0  ID: Kevin Mcgrath, male    DOB: Aug 07, 1981, 38 y.o.   MRN: [id number]  Chief Complaint  Patient presents with  . Hypertension  . Chest Pain  . Follow-up    HPI  38 y/o [place] American male with hyertension, h/o tobacco abuse, was seen by me for exertional dyspnea and atypical chest pain in 10/2018.  Prior work-up has showed hypertensive heart disease with no ischemia or infarction on stress test.   Patient made an appointment today for recurrent chest pain.  Chest pain is sharp, left-sided, worse with palpation, and lasted the entire day.  Pain has now resolved.  Blood pressure is elevated today, however, he states pressure is always lower on home checks.  Past Medical History:  Diagnosis Date  . Atypical chest pain 03/08/2019  . Hypertension   . Laboratory examination 03/08/2019     History reviewed. No pertinent surgical history.   Social History   Socioeconomic History  . Marital status: Married    Spouse name: Not on file  . Number of children: 3  . Years of education: Not on file  . Highest education level: Not on file  Occupational History  . Not on file  Social Needs  . Financial resource strain: Not on file  . Food insecurity    Worry: Not on file    Inability: Not on file  . Transportation needs    Medical: Not on file    Non-medical: Not on file  Tobacco Use  . Smoking status: Current Every Day Smoker    Packs/day: 0.50    Types: Cigarettes  . Smokeless tobacco: Never Used  Substance and Sexual Activity  . Alcohol use: Yes    Comment: occ  . Drug use: Never  . Sexual activity: Not on file  Lifestyle  . Physical activity    Days per week: Not on file    Minutes per session: Not on file  . Stress: Not on file  Relationships  . Social Herbalist on phone: Not on file    Gets together: Not on file    Attends religious service: Not on file    Active member of club or  organization: Not on file    Attends meetings of clubs or organizations: Not on file    Relationship status: Not on file  . Intimate partner violence    Fear of current or ex partner: Not on file    Emotionally abused: Not on file    Physically abused: Not on file    Forced sexual activity: Not on file  Other Topics Concern  . Not on file  Social History Narrative   ** Merged History Encounter **         Current Outpatient Medications on File Prior to Visit  Medication Sig Dispense Refill  . AMLODIPINE BESYLATE PO Take 5 mg by mouth daily.     . carvedilol (COREG) 6.25 MG tablet Take 6.25 mg by mouth daily.    Marland Kitchen ibuprofen (ADVIL,MOTRIN) 200 MG tablet Take 400 mg by mouth every 6 (six) hours as needed. For pain    . nitroGLYCERIN (NITROSTAT) 0.4 MG SL tablet Place 0.4 mg under the tongue every 5 (five) minutes as needed for chest pain.     No current facility-administered medications on file prior to visit.     Cardiovascular studies:  EKG 06/03/2019: Sinus rhythm 76 BPM Normal  EKG  Exercise sestamibi stress test 11/27/2018: 1. The patient performed treadmill exercise using Bruce protocol, completing 7:09 minutes. The patient completed an estimated workload of 8.8 METS, reaching 86% of the maximum predicted heart rate. Normal hemodynamic response and exercise capacity was seen. Stress symptoms included dyspnea. 2. The overall quality of the study is excellent. There is no evidence of abnormal lung activity. Stress and rest SPECT images demonstrate homogeneous tracer distribution throughout the myocardium. Gated SPECT imaging reveals normal myocardial thickening and wall motion. The left ventricular ejection fraction was normal (50%). No ischemic changes seen on stress electrocardiogram. 3. Low risk study.  Echocardiogram 11/10/2018: Left ventricle cavity is normal in size. Moderate concentric hypertrophy of the left ventricle. Normal global wall motion. Doppler evidence of grade I  (impaired) diastolic dysfunction, normal LAP. Calculated EF 55%. Left atrial cavity is mildly dilated. 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 Mild tricuspid regurgitation. No evidence of pulmonary hypertension.  EKG 10/21/2018: Sinus rhythm 79 bpm. Normal axis. Normal conduction. Bilateral T wave inversion, cannot exclude ischemia.   CT head 03/30/2018: Normal exam.  Recent labs: 10/16/2018: H/H 13/40. MCV 85. Platelets 242 Glucose 107. BUN/creatinine 10/1.5. EGFR 50. Sodium 143, potassium 4.3 Cholesterol 165, triglycerides 138, HDL 48, LDL 89.   03/30/2018: H/H 13/39. MCV 85. Platelets 209 Glucose 11. BUN/creatinine 14/1.16. EGFR normal. Sodium 136, potassium 3.5. Troponin negative.   Review of Systems  Constitution: Negative for decreased appetite, malaise/fatigue, weight gain and weight loss.  HENT: Negative for congestion.   Eyes: Negative for visual disturbance.  Cardiovascular: Positive for chest pain (Occasional). Negative for claudication, dyspnea on exertion, leg swelling, palpitations and syncope.  Respiratory: Negative for shortness of breath.   Endocrine: Negative for cold intolerance.  Hematologic/Lymphatic: Does not bruise/bleed easily.  Skin: Negative for itching and rash.  Musculoskeletal: Negative for myalgias.  Gastrointestinal: Negative for abdominal pain, nausea and vomiting.  Genitourinary: Negative for dysuria.  Neurological: Negative for dizziness and weakness.  Psychiatric/Behavioral: The patient is not nervous/anxious.   All other systems reviewed and are negative.      Objective:   There were no vitals filed for this visit.   Physical Exam  Constitutional: He is oriented to person, place, and time. He appears well-developed and well-nourished. No distress.  HENT:  Head: Normocephalic and atraumatic.  Eyes: Pupils are equal, round, and reactive to light. Conjunctivae are normal.  Neck: No JVD present.  Cardiovascular: Normal rate,  regular rhythm and intact distal pulses.  Pulmonary/Chest: Effort normal and breath sounds normal. He has no wheezes. He has no rales.  Abdominal: Soft. Bowel sounds are normal. There is no rebound.  Musculoskeletal:        General: No edema.  Lymphadenopathy:    He has no cervical adenopathy.  Neurological: He is alert and oriented to person, place, and time. No cranial nerve deficit.  Skin: Skin is warm and dry.  Psychiatric: He has a normal mood and affect.  Nursing note and vitals reviewed.       Assessment & Recommendations:   38 y/o [place] American male with hyertension, CKD 3, with chest pain:  Chest pain: Musculoskeletal. Normal EKG today. Recent normal stress test. No further cardiac workup necessary.   Hypertension: Suboptimal control. Reportedly normal on home checks. Continue follow up with PCP.   I will see him on as-needed basis.  Nigel Mormon, MD [facility] [place] Pager: [phone] Office: [phone] If no answer Cell [phone]

## 2019-06-05 ENCOUNTER — Encounter: Payer: Self-pay | Admitting: Cardiology

## 2021-06-21 ENCOUNTER — Other Ambulatory Visit: Payer: Self-pay

## 2021-06-21 ENCOUNTER — Encounter: Payer: Self-pay | Admitting: Emergency Medicine

## 2021-06-21 ENCOUNTER — Ambulatory Visit (INDEPENDENT_AMBULATORY_CARE_PROVIDER_SITE_OTHER): Payer: 59

## 2021-06-21 ENCOUNTER — Ambulatory Visit
Admission: EM | Admit: 2021-06-21 | Discharge: 2021-06-21 | Disposition: A | Discharge status: Home / Self Care | Payer: 59 | Attending: Physician Assistant | Admitting: Physician Assistant

## 2021-06-21 DIAGNOSIS — M25511 Pain in right shoulder: Secondary | ICD-10-CM

## 2021-06-21 DIAGNOSIS — M6520 Calcific tendinitis, unspecified site: Secondary | ICD-10-CM

## 2021-06-21 DIAGNOSIS — R030 Elevated blood-pressure reading, without diagnosis of hypertension: Secondary | ICD-10-CM

## 2021-06-21 DIAGNOSIS — M25611 Stiffness of right shoulder, not elsewhere classified: Secondary | ICD-10-CM | POA: Diagnosis not present

## 2021-06-21 NOTE — ED Triage Notes (Signed)
Right shoulder pain starting yesterday morning, woke up with it. States he hasn't done any physical activity that could've caused it. Mild swelling in right arm/shoulder. Painful to touch and move. No visible injury.

## 2021-06-21 NOTE — ED Provider Notes (Signed)
[facility]    CSN: [id number] Arrival date & time: 06/21/21  1032      History   Chief Complaint Chief Complaint  Patient presents with  . Arm Pain    HPI Kevin Mcgrath is a 40 y.o. male.   Patient presents today with a 2-day history of right shoulder pain.  Pain is rated 8 on a 0-10 pain scale, localized to lateral right shoulder without radiation, described as sharp, worse with palpation or certain activities or movements, no alleviating factors identified.  He has tried ibuprofen without improvement of symptoms.  He denies known injury or increase in activity prior to symptom onset.  He is right-handed.  He denies any neck pain, numbness, paresthesias.  He denies previous injury or surgery to right arm/shoulder.  He is unable to form daily activities as result of symptoms.  Patient was noted to have elevated blood pressure on intake.  Reports history of whitecoat syndrome.  He took his blood pressure earlier today and it was 150 /??.  He does have a history of hypertension and took medication earlier this morning.  He denies any chest pain, shortness of breath, peripheral edema, headache, visual disturbance.  Reports his blood pressure is usually very elevated in a doctor's office.   Past Medical History:  Diagnosis Date  . Atypical chest pain 03/08/2019  . Hypertension   . Laboratory examination 03/08/2019    Patient Active Problem List   Diagnosis Date Noted  . Atypical chest pain 03/08/2019  . Laboratory examination 03/08/2019  . Essential hypertension 11/26/2013    History reviewed. No pertinent surgical history.     Home Medications    Prior to Admission medications   Medication Sig Start Date End Date Taking? Authorizing Provider  AMLODIPINE BESYLATE PO Take 5 mg by mouth daily.    Yes [provider]  nitroGLYCERIN (NITROSTAT) 0.4 MG SL tablet Place 0.4 mg under the tongue every 5 (five) minutes as needed for chest pain.    [provider]    Family History Family History  Problem Relation Age of Onset  . Stroke Mother     Social History Social History   Tobacco Use  . Smoking status: Every Day    Packs/day: 0.25    Pack years: 0.00    Types: Cigarettes  . Smokeless tobacco: Never  Vaping Use  . Vaping Use: Never used  Substance Use Topics  . Alcohol use: Yes    Comment: occasionally on weekend/socially  . Drug use: Never     Allergies   Norvasc [amlodipine besylate]   Review of Systems Review of Systems  Constitutional:  Positive for activity change. Negative for appetite change, fatigue and fever.  Eyes:  Negative for visual disturbance.  Respiratory:  Negative for cough and shortness of breath.   Cardiovascular:  Negative for chest pain.  Gastrointestinal:  Negative for abdominal pain, diarrhea, nausea and vomiting.  Musculoskeletal:  Positive for arthralgias. Negative for joint swelling.  Neurological:  Negative for dizziness, weakness, light-headedness, numbness and headaches.    Physical Exam Triage Vital Signs ED Triage Vitals [06/21/21 1205]  Enc Vitals Group     BP      Pulse      Resp      Temp      Temp src      SpO2      Weight      Height      Head Circumference  Peak Flow      Pain Score 8     Pain Loc      Pain Edu?      Excl. in GC?    No data found.  Updated Vital Signs BP (!) 173/115 (BP Location: Left Arm)   Pulse 80   Temp 98.7 F (37.1 C) (Oral)   Resp 14   SpO2 98%   Visual Acuity Right Eye Distance:   Left Eye Distance:   Bilateral Distance:    Right Eye Near:   Left Eye Near:    Bilateral Near:     Physical Exam Vitals reviewed.  Constitutional:      General: He is awake.     Appearance: Normal appearance. He is normal weight. He is not ill-appearing.     Comments: Very pleasant male appears stated age in no acute distress sitting comfortably in exam room  HENT:     Head: Normocephalic and atraumatic.     Mouth/Throat:      Pharynx: No oropharyngeal exudate, posterior oropharyngeal erythema or uvula swelling.  Cardiovascular:     Rate and Rhythm: Normal rate and regular rhythm.     Pulses:          Radial pulses are 2+ on the right side and 2+ on the left side.     Heart sounds: Normal heart sounds, S1 normal and S2 normal. No murmur heard. Pulmonary:     Effort: Pulmonary effort is normal.     Breath sounds: Normal breath sounds. No stridor. No wheezing, rhonchi or rales.     Comments: Clear to auscultation bilaterally Musculoskeletal:     Right shoulder: Tenderness present. No swelling, deformity or bony tenderness. Decreased range of motion. Normal strength.     Right lower leg: No edema.     Left lower leg: No edema.     Comments: Right shoulder: Decreased range of motion with forward flexion, abduction, internal and external rotation.  No deformity noted.  Tenderness palpation over glenohumeral head.  Normal pincer and grip strength.  Hand neurovascularly intact.  Patient unable to perform special test due to decreased range of motion.  Neurological:     Mental Status: He is alert.  Psychiatric:        Behavior: Behavior is cooperative.     UC Treatments / Results  Labs (all labs ordered are listed, but only abnormal results are displayed) Labs Reviewed - No data to display  EKG   Radiology DG Shoulder Right  Result Date: 06/21/2021 CLINICAL DATA:  DROM, point tenderness at lateral shoulder EXAM: RIGHT SHOULDER - 2+ VIEW COMPARISON:  None. FINDINGS: There is no evidence of acute fracture. There is globular calcification overlying the distal rotator cuff. There is minimal glenohumeral and AC joint degenerative change. 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 Electronically Signed   By: Caprice Renshaw   On: 06/21/2021 13:00    Procedures Procedures (including critical care time)  Medications Ordered in  UC Medications - No data to display  Initial Impression / Assessment and Plan / UC Course  I have reviewed the triage vital signs and the nursing notes.  Pertinent labs & imaging results that were available during my care of the patient were reviewed by me and considered in my medical decision making (see chart for details).      X-ray obtained given bony tenderness consistent with calcific tendinosis of rotator cuff.  Encourage patient to  follow-up with orthopedics to consider intra-articular injection to manage symptoms.  He can use Tylenol for breakthrough pain.  We discussed potential utility of NSAIDs but given elevated blood pressure today recommended that he hold off on initiating NSAIDs until BP improves.  Discussed alarm symptoms that warrant emergent evaluation.  Strict return precautions given to which patient expressed understanding.  Blood pressure was elevated today but patient reports this is common at doctors offices.  It was more normal this morning when he took it.  He will monitor this at home.  He denies any signs/symptoms of endorgan damage.  Discussed alarm symptoms that warrant emergent evaluation.  He is to follow-up with our clinic or PCP if s blood pressure remains above 140/90.  Strict return precautions given to which patient expressed understanding.  Final Clinical Impressions(s) / UC Diagnoses   Final diagnoses:  Acute pain of right shoulder  Decreased range of motion of right shoulder  Elevated blood pressure reading  Calcific tendonitis     Discharge Instructions      Take Tylenol for pain.  Follow-up with orthopedic provider to consider injection as we discussed.  If your blood pressure improves you can consider over-the-counter ibuprofen but would not take this if your blood pressure remains elevated.  If you have any sudden worsening of symptoms go to the emergency room.  Please monitor blood pressure at home.  If you develop any headache, dizziness,  chest pain, shortness of breath, leg swelling, vision changes with elevated blood pressure reading you need to go to the emergency room.  If your blood pressure remains elevated at home above 140/90 despite your medication you need to be reevaluated by our clinic or your primary care provider for medication adjustment.     ED Prescriptions   None    PDMP not reviewed this encounter.   Jeani Hawking, PA-C 06/21/21 1309

## 2021-06-21 NOTE — Discharge Instructions (Addendum)
Take Tylenol for pain.  Follow-up with orthopedic provider to consider injection as we discussed.  If your blood pressure improves you can consider over-the-counter ibuprofen but would not take this if your blood pressure remains elevated.  If you have any sudden worsening of symptoms go to the emergency room.  Please monitor blood pressure at home.  If you develop any headache, dizziness, chest pain, shortness of breath, leg swelling, vision changes with elevated blood pressure reading you need to go to the emergency room.  If your blood pressure remains elevated at home above 140/90 despite your medication you need to be reevaluated by our clinic or your primary care provider for medication adjustment.

## 2021-06-22 ENCOUNTER — Other Ambulatory Visit: Payer: Self-pay | Admitting: Internal Medicine

## 2021-06-25 LAB — CBC
HCT: 43.7 % (ref 38.5–50.0)
Hemoglobin: 14.2 g/dL (ref 13.2–17.1)
MCH: 28.6 pg (ref 27.0–33.0)
MCHC: 32.5 g/dL (ref 32.0–36.0)
MCV: 87.9 fL (ref 80.0–100.0)
MPV: 12.1 fL (ref 7.5–12.5)
Platelets: 221 10*3/uL (ref 140–400)
RBC: 4.97 10*6/uL (ref 4.20–5.80)
RDW: 12.7 % (ref 11.0–15.0)
WBC: 8.3 10*3/uL (ref 3.8–10.8)

## 2021-06-25 LAB — TSH: TSH: 2.02 mIU/L (ref 0.40–4.50)

## 2021-06-25 LAB — COMPLETE METABOLIC PANEL WITH GFR

## 2021-06-25 LAB — PSA: PSA: 0.75 ng/mL (ref <=4.00)

## 2021-06-25 LAB — VITAMIN D 25 HYDROXY (VIT D DEFICIENCY, FRACTURES): Vit D, 25-Hydroxy: 27 ng/mL — ABNORMAL LOW (ref 30–100)

## 2021-06-25 LAB — LIPID PANEL

## 2024-07-30 ENCOUNTER — Other Ambulatory Visit: Payer: Self-pay | Admitting: Family Medicine

## 2024-07-30 ENCOUNTER — Ambulatory Visit
Admission: RE | Admit: 2024-07-30 | Discharge: 2024-07-30 | Disposition: A | Discharge status: Home / Self Care | Source: Ambulatory Visit | Attending: Family Medicine | Admitting: Family Medicine

## 2024-07-30 DIAGNOSIS — M549 Dorsalgia, unspecified: Secondary | ICD-10-CM

## 2024-07-30 DIAGNOSIS — M545 Low back pain, unspecified: Secondary | ICD-10-CM
